# Patient Record
Sex: Male | Born: 1970 | Race: White | Hispanic: No | Marital: Married | Smoking: Never smoker
Health system: Southern US, Community
[De-identification: ages and names within clinical notes are randomized; demographics above are authoritative.]

## PROBLEM LIST (undated history)

## (undated) DIAGNOSIS — H532 Diplopia: Secondary | ICD-10-CM

## (undated) DIAGNOSIS — K219 Gastro-esophageal reflux disease without esophagitis: Secondary | ICD-10-CM

## (undated) HISTORY — PX: HERNIA REPAIR: SHX51

## (undated) HISTORY — PX: TUMOR EXCISION: SHX421

## (undated) HISTORY — DX: Diplopia: H53.2

## (undated) HISTORY — PX: WRIST SURGERY: SHX841

---

## 2017-06-22 DIAGNOSIS — J019 Acute sinusitis, unspecified: Secondary | ICD-10-CM | POA: Diagnosis not present

## 2017-11-28 DIAGNOSIS — J111 Influenza due to unidentified influenza virus with other respiratory manifestations: Secondary | ICD-10-CM | POA: Diagnosis not present

## 2017-11-30 DIAGNOSIS — J208 Acute bronchitis due to other specified organisms: Secondary | ICD-10-CM | POA: Diagnosis not present

## 2018-05-11 DIAGNOSIS — Z1211 Encounter for screening for malignant neoplasm of colon: Secondary | ICD-10-CM | POA: Diagnosis not present

## 2018-05-11 DIAGNOSIS — Z0000 Encounter for general adult medical examination without abnormal findings: Secondary | ICD-10-CM | POA: Diagnosis not present

## 2018-05-11 DIAGNOSIS — K219 Gastro-esophageal reflux disease without esophagitis: Secondary | ICD-10-CM | POA: Diagnosis not present

## 2018-05-25 DIAGNOSIS — H9201 Otalgia, right ear: Secondary | ICD-10-CM | POA: Diagnosis not present

## 2018-05-25 DIAGNOSIS — Z6830 Body mass index (BMI) 30.0-30.9, adult: Secondary | ICD-10-CM | POA: Diagnosis not present

## 2018-05-25 DIAGNOSIS — R319 Hematuria, unspecified: Secondary | ICD-10-CM | POA: Diagnosis not present

## 2018-05-29 DIAGNOSIS — L820 Inflamed seborrheic keratosis: Secondary | ICD-10-CM | POA: Diagnosis not present

## 2018-05-29 DIAGNOSIS — L74519 Primary focal hyperhidrosis, unspecified: Secondary | ICD-10-CM | POA: Diagnosis not present

## 2018-05-29 DIAGNOSIS — L739 Follicular disorder, unspecified: Secondary | ICD-10-CM | POA: Diagnosis not present

## 2018-08-31 DIAGNOSIS — E785 Hyperlipidemia, unspecified: Secondary | ICD-10-CM | POA: Diagnosis not present

## 2018-08-31 DIAGNOSIS — K219 Gastro-esophageal reflux disease without esophagitis: Secondary | ICD-10-CM | POA: Diagnosis not present

## 2018-08-31 DIAGNOSIS — R319 Hematuria, unspecified: Secondary | ICD-10-CM | POA: Diagnosis not present

## 2018-10-02 DIAGNOSIS — J019 Acute sinusitis, unspecified: Secondary | ICD-10-CM | POA: Diagnosis not present

## 2020-06-12 ENCOUNTER — Emergency Department (HOSPITAL_BASED_OUTPATIENT_CLINIC_OR_DEPARTMENT_OTHER): Payer: 59

## 2020-06-12 ENCOUNTER — Other Ambulatory Visit: Payer: Self-pay

## 2020-06-12 ENCOUNTER — Encounter (HOSPITAL_BASED_OUTPATIENT_CLINIC_OR_DEPARTMENT_OTHER): Payer: Self-pay | Admitting: *Deleted

## 2020-06-12 ENCOUNTER — Emergency Department (HOSPITAL_BASED_OUTPATIENT_CLINIC_OR_DEPARTMENT_OTHER)
Admission: EM | Admit: 2020-06-12 | Discharge: 2020-06-12 | Disposition: A | Discharge status: Home / Self Care | Payer: 59 | Attending: Emergency Medicine | Admitting: Emergency Medicine

## 2020-06-12 DIAGNOSIS — H539 Unspecified visual disturbance: Secondary | ICD-10-CM

## 2020-06-12 DIAGNOSIS — Z87891 Personal history of nicotine dependence: Secondary | ICD-10-CM | POA: Insufficient documentation

## 2020-06-12 DIAGNOSIS — R030 Elevated blood-pressure reading, without diagnosis of hypertension: Secondary | ICD-10-CM

## 2020-06-12 DIAGNOSIS — I10 Essential (primary) hypertension: Secondary | ICD-10-CM | POA: Insufficient documentation

## 2020-06-12 DIAGNOSIS — H532 Diplopia: Secondary | ICD-10-CM | POA: Insufficient documentation

## 2020-06-12 HISTORY — DX: Gastro-esophageal reflux disease without esophagitis: K21.9

## 2020-06-12 NOTE — Discharge Instructions (Addendum)
The CT scan of your head was without any acute abnormalities although did show some nasal polyps.  Please follow-up with ear nose and throat for this.  However given her symptoms today it is more for to follow-up with a ophthalmologist.  I have given you a referral to 1 you have to call to make an appointment with them.  Please inform them that you were seen in the ER for your symptoms and told to follow-up with the ophthalmologist.

## 2020-06-12 NOTE — ED Provider Notes (Signed)
[facility] EMERGENCY DEPARTMENT Provider Note   CSN: [id number] Arrival date & time: 06/12/20  1922     History Chief Complaint  Patient presents with  . Eye Problem    Roger Huff is a 49 y.o. male.  HPI Patient is a 49 year old male with past medical history of hypertension acid reflux he has not taken medications for his hypertension he is presented today with right eye diplopia.  He states that he noticed this symptom Thursday afternoon and went to an urgent care today and they advised him to come to the emergency department.  He states that he has 20/20 vision and does not wear glasses or contacts.  He denies any headaches, nausea, vomiting, lightheadedness, dizziness, weakness or numbness of the extremities or face.  Denies any other associated symptoms at all.  States he feels completely well and only notices this double vision when he is standing far away.  He does not notice this up close.     Past Medical History:  Diagnosis Date  . Acid reflux   . Hypertension     There are no problems to display for this patient.   Past Surgical History:  Procedure Laterality Date  . HERNIA REPAIR    . WRIST SURGERY         No family history on file.  Social History   Tobacco Use  . Smoking status: Never Smoker  . Smokeless tobacco: Former Network engineer  . Vaping Use: Never used  Substance Use Topics  . Alcohol use: Yes  . Drug use: Never    Home Medications Prior to Admission medications   Medication Sig Start Date End Date Taking? Authorizing Provider  omeprazole (PRILOSEC) 20 MG capsule Take by mouth. 05/19/16  Yes [provider]    Allergies    Patient has no known allergies.  Review of Systems   Review of Systems  Constitutional: Negative for chills and fever.  HENT: Negative for congestion.   Eyes: Positive for visual disturbance.  Respiratory: Negative for shortness of breath.   Cardiovascular: Negative for chest pain.   Gastrointestinal: Negative for abdominal pain.  Musculoskeletal: Negative for neck pain.    Physical Exam Updated Vital Signs BP (!) 139/102 (BP Location: Left Arm)   Pulse 83   Temp 98.4 F (36.9 C) (Oral)   Resp 16   Ht 5' 10"  (1.778 m)   Wt 85.3 kg   SpO2 100%   BMI 26.98 kg/m   Physical Exam Vitals and nursing note reviewed.  Constitutional:      General: He is not in acute distress.    Appearance: Normal appearance. He is not ill-appearing.  HENT:     Head: Normocephalic and atraumatic.     Mouth/Throat:     Mouth: Mucous membranes are moist.  Eyes:     General: No scleral icterus.       Right eye: No discharge.        Left eye: No discharge.     Extraocular Movements: Extraocular movements intact.     Conjunctiva/sclera: Conjunctivae normal.     Comments: Extraocular movements are completely intact.  No visual field cuts.  Pupils are equal reactive to light and accommodation.  Pulmonary:     Effort: Pulmonary effort is normal.     Breath sounds: No stridor.  Neurological:     Mental Status: He is alert and oriented to person, place, and time. Mental status is at baseline.  Comments: Alert and oriented to self, place, time and event.   Speech is fluent, clear without dysarthria or dysphasia.   Strength 5/5 in upper/lower extremities  Sensation intact in upper/lower extremities   Normal gait.  Negative Romberg. No pronator drift.  Normal finger-to-nose and feet tapping.  CN I not tested  CN II grossly intact visual fields bilaterally. Did not visualize posterior eye.   CN III, IV, VI PERRLA and EOMs intact bilaterally  CN V Intact sensation to sharp and light touch to the face  CN VII facial movements symmetric  CN VIII not tested  CN IX, X no uvula deviation, symmetric rise of soft palate  CN XI 5/5 SCM and trapezius strength bilaterally  CN XII Midline tongue protrusion, symmetric L/R movements      ED Results / Procedures / Treatments    Labs (all labs ordered are listed, but only abnormal results are displayed) Labs Reviewed - No data to display  EKG None  Radiology CT Head Wo Contrast  Result Date: 06/12/2020 CLINICAL DATA:  Double vision. EXAM: CT HEAD WITHOUT CONTRAST TECHNIQUE: Contiguous axial images were obtained from the base of the skull through the vertex without intravenous contrast. COMPARISON:  July 02, 2008 FINDINGS: Brain: No evidence of acute infarction, hemorrhage, hydrocephalus, extra-axial collection or mass lesion/mass effect. Vascular: No hyperdense vessel or unexpected calcification. Skull: Normal. Negative for fracture or focal lesion. Sinuses/Orbits: There is mild bilateral ethmoid sinus and left-sided frontal sinus mucosal thickening. A stable 1.4 cm x 1.4 cm posterior right maxillary sinus polyp versus mucous retention cyst is seen. A new 1.2 cm x 1.0 cm posteromedial left maxillary sinus polyp versus mucous retention cyst is noted. Other: None. IMPRESSION: 1. No acute intracranial abnormality. 2. 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 Electronically Signed   By: Virgina Norfolk M.D.   On: 06/12/2020 22:21    Procedures Procedures (including critical care time)  Medications Ordered in ED Medications - No data to display  ED Course  I have reviewed the triage vital signs and the nursing notes.  Pertinent labs & imaging results that were available during my care of the patient were reviewed by me and considered in my medical decision making (see chart for details).    MDM Rules/Calculators/A&P                          Patient is 49 year old male presented today with diplopia and only his right eye.  I did extensive physical exam to ensure that he had no neurologic abnormalities.  He is without any headache or pain   Discussed case with my attending physician Dr. Rex Kras.  Patient does need ophthalmology referral.  I provided him with this.  He will follow-up with  her ophthalmologist.  CT scan obtained shows no acute abnormality does have some maxillary sinus polyps versus mucous retention cyst which she was informed of.  Patient does have reassuringly normal neurologic exam with no neuro deficits.  He was given return precautions ultimately will need follow-up with PCP and ophthalmology.  Final Clinical Impression(s) / ED Diagnoses Final diagnoses:  Vision changes  Elevated blood pressure reading    Rx / DC Orders ED Discharge Orders    None       Tedd Sias, [place] 06/13/20 0149    Little, Wenda Overland, MD 06/13/20 1523

## 2020-06-12 NOTE — ED Triage Notes (Signed)
Pt reports he has 20/20 vision but does have prescribed glasses that he doesn't wear. States since yesterday he has been having "double vision" with distance. Reading vision is normal. He went to Urgent care and they advised him to come here

## 2020-06-21 ENCOUNTER — Encounter: Payer: Self-pay | Admitting: Neurology

## 2020-06-21 ENCOUNTER — Ambulatory Visit (INDEPENDENT_AMBULATORY_CARE_PROVIDER_SITE_OTHER): Payer: 59 | Admitting: Neurology

## 2020-06-21 DIAGNOSIS — S0440XA Injury of abducent nerve, unspecified side, initial encounter: Secondary | ICD-10-CM

## 2020-06-21 DIAGNOSIS — H532 Diplopia: Secondary | ICD-10-CM | POA: Diagnosis not present

## 2020-06-21 DIAGNOSIS — S0442XS Injury of abducent nerve, left side, sequela: Secondary | ICD-10-CM

## 2020-06-21 HISTORY — DX: Injury of abducent nerve, unspecified side, initial encounter: S04.40XA

## 2020-06-21 HISTORY — DX: Diplopia: H53.2

## 2020-06-21 NOTE — Progress Notes (Signed)
Reason for visit: Double vision  Referring physician: Dr. Otho Ket Mcclafferty is a 49 y.o. male  History of present illness:  [name] is a 49 year old white male with a history of onset of double vision around 12 June 2020.  The day before he noted he was having trouble focusing when he looked to one side or the other.  The patient then began to have double vision on primary gaze in a horizontal fashion.  The patient was noted by his eye doctor to have a left abducens palsy.  He is sent to this office for an evaluation.  He has not had any droopiness of the eyelids, he has not had any numbness or weakness of the face, arms, or legs.  He denies any significant problems with headaches or eye pain.  His maternal grandmother and his mother had diabetes, but he has no personal history of this.  He reports no difficulty with speech or swallowing or difficulty with gait instability.  He currently is on short-term disability.  He is wearing an eye patch.  Past Medical History:  Diagnosis Date  . Acid reflux   . Binocular vision disorder with diplopia     Past Surgical History:  Procedure Laterality Date  . HERNIA REPAIR    . TUMOR EXCISION     fatty tumor removed from back on neck  . WRIST SURGERY      Family History  Problem Relation Age of Onset  . Pulmonary fibrosis Mother   . Skin cancer Mother   . COPD Father   . Heart disease Father   . Prostate cancer Father     Social history:  reports that he has never smoked. He has quit using smokeless tobacco. He reports current alcohol use. He reports that he does not use drugs.  Medications:  Prior to Admission medications   Medication Sig Start Date End Date Taking? Authorizing Provider  Ascorbic Acid (VITAMIN C) 1000 MG tablet Take 1,000 mg by mouth daily.   Yes [provider]  Cholecalciferol (VITAMIN D3 PO) Take 1 tablet by mouth daily.   Yes [provider]  omeprazole (PRILOSEC) 20 MG capsule  Take 20 mg by mouth daily.   Yes [provider]     No Known Allergies  ROS:  Out of a complete 14 system review of symptoms, the patient complains only of the following symptoms, and all other reviewed systems are negative.  Double vision  Blood pressure 122/80, pulse 71, height 5' 10"  (1.778 m), weight 186 lb 8 oz (84.6 kg).  Physical Exam  General: The patient is alert and cooperative at the time of the examination.  Eyes: Pupils are equal, round, and reactive to light. Discs are flat bilaterally.  Neck: The neck is supple, no carotid bruits are noted.  Respiratory: The respiratory examination is clear.  Cardiovascular: The cardiovascular examination reveals a regular rate and rhythm, no obvious murmurs or rubs are noted.  Skin: Extremities are without significant edema.  Neurologic Exam  Mental status: The patient is alert and oriented x 3 at the time of the examination. The patient has apparent normal recent and remote memory, with an apparently normal attention span and concentration ability.  Cranial nerves: Facial symmetry is present. There is good sensation of the face to pinprick and soft touch bilaterally. The strength of the facial muscles and the muscles to head turning and shoulder shrug are normal bilaterally. Speech is well enunciated, no aphasia or  dysarthria is noted. Extraocular movements are full, with exception that there is incomplete abduction of the left eye with left lateral gaze, on primary gaze the left eye is medially deviated. Visual fields are full. The tongue is midline, and the patient has symmetric elevation of the soft palate. No obvious hearing deficits are noted.  Motor: The motor testing reveals 5 over 5 strength of all 4 extremities. Good symmetric motor tone is noted throughout.  Sensory: Sensory testing is intact to pinprick, soft touch, vibration sensation, and position sense on all 4 extremities. No evidence of extinction is  noted.  Coordination: Cerebellar testing reveals good finger-nose-finger and heel-to-shin bilaterally.  Gait and station: Gait is normal. Tandem gait is normal. Romberg is negative. No drift is seen.  Reflexes: Deep tendon reflexes are symmetric and normal bilaterally. Toes are downgoing bilaterally.   CT head 06/12/20:  IMPRESSION: 1. No acute intracranial abnormality. 2. 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  * CT scan images were reviewed online. I agree with the written report.    Assessment/Plan:  1.  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  He has no personal history of hypertension, diabetes or prior head trauma.  The patient was sent for blood work today, he will have MRI of the brain.  He will follow up here in 3 months.  I have indicated that the patient may operate a motor vehicle with an eye patch, he has to be cognizant of the fact that he would not have depth perception as well as usual.    [name] MD 06/21/2020 11:26 AM  [facility] [address] [place], [place] [address]  Phone [phone] Fax [phone]

## 2020-06-22 ENCOUNTER — Telehealth: Payer: Self-pay | Admitting: Neurology

## 2020-06-22 LAB — COMPREHENSIVE METABOLIC PANEL
ALT: 27 IU/L (ref 0–44)
AST: 20 IU/L (ref 0–40)
Albumin/Globulin Ratio: 1.9 (ref 1.2–2.2)
Albumin: 4.8 g/dL (ref 3.8–4.9)
Alkaline Phosphatase: 63 IU/L (ref 44–121)
BUN/Creatinine Ratio: 17 (ref 9–20)
BUN: 16 mg/dL (ref 6–24)
Bilirubin Total: 0.4 mg/dL (ref 0.0–1.2)
CO2: 22 mmol/L (ref 20–29)
Calcium: 9.7 mg/dL (ref 8.7–10.2)
Chloride: 102 mmol/L (ref 96–106)
Creatinine, Ser: 0.96 mg/dL (ref 0.76–1.27)
GFR calc Af Amer: 105 mL/min/{1.73_m2} (ref >59)
GFR calc non Af Amer: 91 mL/min/{1.73_m2} (ref >59)
Globulin, Total: 2.5 g/dL (ref 1.5–4.5)
Glucose: 86 mg/dL (ref 65–99)
Potassium: 4.7 mmol/L (ref 3.5–5.2)
Sodium: 139 mmol/L (ref 134–144)
Total Protein: 7.3 g/dL (ref 6.0–8.5)

## 2020-06-22 LAB — VITAMIN B12: Vitamin B-12: 529 pg/mL (ref 232–1245)

## 2020-06-22 LAB — HEMOGLOBIN A1C
Est. average glucose Bld gHb Est-mCnc: 111 mg/dL
Hgb A1c MFr Bld: 5.5 % (ref 4.8–5.6)

## 2020-06-22 LAB — TSH: TSH: 0.971 u[IU]/mL (ref 0.450–4.500)

## 2020-06-22 LAB — ACETYLCHOLINE RECEPTOR, BINDING: AChR Binding Ab, Serum: 0.03 nmol/L (ref 0.00–0.24)

## 2020-06-22 LAB — ANGIOTENSIN CONVERTING ENZYME: Angio Convert Enzyme: 26 U/L (ref 14–82)

## 2020-06-22 LAB — SEDIMENTATION RATE: Sed Rate: 2 mm/hr (ref 0–30)

## 2020-06-22 LAB — ANA W/REFLEX: Anti Nuclear Antibody (ANA): NEGATIVE

## 2020-06-22 LAB — B. BURGDORFI ANTIBODIES: Lyme IgG/IgM Ab: <0.91 {ISR} (ref 0.00–0.90)

## 2020-06-22 NOTE — Telephone Encounter (Signed)
no to the covid questions MR Brain w/wo contrast Dr. Jannifer Franklin [facility] Josem Kaufmann: [id number] (exp. 06/22/20 to 08/06/20). Patient is scheduled at [facility] for 06/23/20.

## 2020-06-23 ENCOUNTER — Ambulatory Visit (INDEPENDENT_AMBULATORY_CARE_PROVIDER_SITE_OTHER): Payer: 59

## 2020-06-23 ENCOUNTER — Telehealth: Payer: Self-pay | Admitting: Emergency Medicine

## 2020-06-23 DIAGNOSIS — H532 Diplopia: Secondary | ICD-10-CM | POA: Diagnosis not present

## 2020-06-23 MED ORDER — GADOBENATE DIMEGLUMINE 529 MG/ML IV SOLN
15.0000 mL | Freq: Once | INTRAVENOUS | Status: AC | PRN
  Administered 2020-06-23: 15 mL via INTRAVENOUS

## 2020-06-23 NOTE — Telephone Encounter (Signed)
Called patient and went over Dr. Tobey Grim review of patient's recent blood work.  Patient had no questions and expressed appreciation for the call.

## 2020-06-23 NOTE — Telephone Encounter (Signed)
-----   Message from Kathrynn Ducking, MD sent at 06/22/2020  6:28 PM EST -----  The blood work results are unremarkable. Please call the patient. ----- Message ----- From: Lavone Neri Lab Results In Sent: 06/22/2020   7:37 AM EST To: Kathrynn Ducking, MD

## 2020-06-25 ENCOUNTER — Telehealth: Payer: Self-pay | Admitting: Neurology

## 2020-06-25 NOTE — Telephone Encounter (Signed)
I called the patient.  MRI was unremarkable, expect gradual improvement in double vision over the next 3 to 5 months.

## 2020-06-25 NOTE — Telephone Encounter (Signed)
MRI of the brain was unremarkable, some sinus disease noted, nothing that would result in double vision.   MRI brain 06/25/20:  IMPRESSION:   Unremarkable MRI brain (with and without). No acute findings. Incidental mucus thickening in the ethmoid, frontal and maxillary sinuses.

## 2020-07-07 ENCOUNTER — Telehealth: Payer: Self-pay | Admitting: Neurology

## 2020-07-07 NOTE — Telephone Encounter (Signed)
Pt. Paid the fee via phone for his [name] form to be filled out.

## 2020-07-12 ENCOUNTER — Telehealth: Payer: Self-pay | Admitting: Emergency Medicine

## 2020-07-12 DIAGNOSIS — Z0289 Encounter for other administrative examinations: Secondary | ICD-10-CM

## 2020-07-12 NOTE — Telephone Encounter (Signed)
Called and spoke to [facility], notified her that Dr. Jannifer Franklin will not be back in office til Monday.  She sent message to examiner and allowing additional time to complete and send in paperwork.

## 2020-07-12 NOTE — Telephone Encounter (Addendum)
Pt wife called to check on status of form to be filled out by MD. She spoke with work place and they have not received completed form yet. Advised I will send his nurse a message to call back with an update on this.Call back number: [phone].

## 2020-07-12 NOTE — Telephone Encounter (Signed)
Shirlean Mylar, patient's wife, is calling again just to check the status of patient's paperwork. Requesting a call back.

## 2020-07-13 NOTE — Telephone Encounter (Signed)
Baldomero Lamy, RN 07/12/20 10:37 AM Note: Called and spoke to [facility], notified her that Dr. Jannifer Franklin will not be back in office til Monday.  She sent message to examiner and allowing additional time to complete and send in paperwork.       Separate phone call about this matter (see above). I called his wife back and provided her with this update.

## 2020-07-13 NOTE — Telephone Encounter (Signed)
I spoke to the patient. States he is concerned about driving and climbing heights with his diplopia. He would like to discuss his restrictions and return to work date with Dr. Jannifer Franklin. Mr. Ogan is aware that he is out of the office until 07/19/20.  The best number to reach the patient is [phone].

## 2020-07-13 NOTE — Telephone Encounter (Signed)
Wife called back wanting to speak with nurse. Call transferred to [facility].

## 2020-07-15 NOTE — Telephone Encounter (Signed)
I called patient.  The patient apparently has been taken out of work by the ophthalmologist.  I have indicated that he may operate a motor vehicle as [employer] patches 1 eye, he has to be careful about keeping distance between his vehicle and the next one in front.  This is documented in my original note.  He claimed that he would be seen by the ophthalmologist on the 12th or 13 January, he wants me to write him out of work until then.  I will fill out the FMLA form when I return to the office.

## 2020-07-19 NOTE — Telephone Encounter (Signed)
Disability paperwork completed by Dr. Jannifer Franklin and ready for pickup.  Hilda Blades in Medical Records has the paperwork.

## 2020-09-09 NOTE — Telephone Encounter (Signed)
Received fax requesting last office notes.  Information faxed this morning to the number provided.  OK transmission received.

## 2020-09-16 NOTE — Progress Notes (Signed)
PATIENT: Roger Huff DOB: 1970-07-30  REASON FOR VISIT: follow up HISTORY FROM: patient  HISTORY OF PRESENT ILLNESS: Today 09/20/20 Roger Huff is a 50 year old male with history of onset of double vision in November 2021.  He was diagnosed with a left abducens palsy.  MRI of the brain was unremarkable, some sinus disease noted, nothing that would result in double vision.  Extensive laboratory evaluation was unremarkable.  He is about 3-1/2 months out from onset, is about 95% back to normal.  Is no longer using the patch.  Only has double vision with left horizontal gaze if looking hard, can bring it into focus.  Is back at work, works for a [employer], office and hands-on job.  Has a follow-up with his ophthalmologist next week.  Here today for evaluation accompanied by his wife.  HISTORY  06/21/2020 Dr. Jannifer Franklin: Roger Huff is a 50 year old white male with a history of onset of double vision around 12 June 2020.  The day before he noted he was having trouble focusing when he looked to one side or the other.  The patient then began to have double vision on primary gaze in a horizontal fashion.  The patient was noted by his eye doctor to have a left abducens palsy.  He is sent to this office for an evaluation.  He has not had any droopiness of the eyelids, he has not had any numbness or weakness of the face, arms, or legs.  He denies any significant problems with headaches or eye pain.  His maternal grandmother and his mother had diabetes, but he has no personal history of this.  He reports no difficulty with speech or swallowing or difficulty with gait instability.  He currently is on short-term disability.  He is wearing an eye patch.  REVIEW OF SYSTEMS: Out of a complete 14 system review of symptoms, the patient complains only of the following symptoms, and all other reviewed systems are negative.  n/a  ALLERGIES: No Known Allergies  HOME MEDICATIONS: Outpatient Medications Prior  to Visit  Medication Sig Dispense Refill  . Ascorbic Acid (VITAMIN C) 1000 MG tablet Take 1,000 mg by mouth daily.    . Cholecalciferol (VITAMIN D3 PO) Take 1 tablet by mouth daily.    Marland Kitchen omeprazole (PRILOSEC) 20 MG capsule Take 20 mg by mouth daily.     No facility-administered medications prior to visit.    PAST MEDICAL HISTORY: Past Medical History:  Diagnosis Date  . 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 Acid reflux   . Binocular vision disorder with diplopia   . Diplopia 06/21/2020    PAST SURGICAL HISTORY: Past Surgical History:  Procedure Laterality Date  . HERNIA REPAIR    . TUMOR EXCISION     fatty tumor removed from back on neck  . WRIST SURGERY      FAMILY HISTORY: Family History  Problem Relation Age of Onset  . Pulmonary fibrosis Mother   . Skin cancer Mother   . Diabetes Mother   . COPD Father   . Heart disease Father   . Prostate cancer Father   . Healthy Brother     SOCIAL HISTORY: Social History   Socioeconomic History  . Marital status: Married    Spouse name: Not on file  . Number of children: 2  . Years of education: some college  . Highest education level: Not on file  Occupational History  . 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 Smoking status: Never Smoker  . Smokeless tobacco: Former Network engineer  . Vaping Use: Never used  Substance and Sexual Activity  . Alcohol use: Yes    Comment: social  . Drug use: Never  . Sexual activity: Not on file  Other Topics Concern  . Not on file  Social History Narrative   Lives at home with his wife.   Right-handed.   Caffeine use: 20 ounces of coffee per day.   Social Determinants of Health   Financial Resource Strain: Not on file  Food Insecurity: Not on file  Transportation Needs: Not on file  Physical Activity: Not on file  Stress: Not on file  Social Connections: Not on file  Intimate Partner Violence: Not on file   PHYSICAL EXAM  Vitals:    09/20/20 1016  BP: 134/84  Pulse: 77   There is no height or weight on file to calculate BMI.  Generalized: Well developed, in no acute distress   Neurological examination  Mentation: Alert oriented to time, place, history taking. Follows all commands speech and language fluent Cranial nerve II-XII: Pupils were equal round reactive to light. Extraocular movements were full, visual field were full on confrontational test, complete abduction of the left eye with lateral gaze. Facial sensation and strength were normal.  Head turning and shoulder shrug  were normal and symmetric. Motor: The motor testing reveals 5 over 5 strength of all 4 extremities. Good symmetric motor tone is noted throughout.  Sensory: Sensory testing is intact to soft touch on all 4 extremities. No evidence of extinction is noted.  Coordination: Cerebellar testing reveals good finger-nose-finger and heel-to-shin bilaterally.  Gait and station: Gait is normal. Tandem gait is normal. Romberg is negative. No drift is seen.  Reflexes: Deep tendon reflexes are symmetric and normal bilaterally.   DIAGNOSTIC DATA (LABS, IMAGING, TESTING) - I reviewed patient records, labs, notes, testing and imaging myself where available.  No results found for: WBC, HGB, HCT, MCV, PLT    Component Value Date/Time   NA 139 06/21/2020 1157   K 4.7 06/21/2020 1157   CL 102 06/21/2020 1157   CO2 22 06/21/2020 1157   GLUCOSE 86 06/21/2020 1157   BUN 16 06/21/2020 1157   CREATININE 0.96 06/21/2020 1157   CALCIUM 9.7 06/21/2020 1157   PROT 7.3 06/21/2020 1157   ALBUMIN 4.8 06/21/2020 1157   AST 20 06/21/2020 1157   ALT 27 06/21/2020 1157   ALKPHOS 63 06/21/2020 1157   BILITOT 0.4 06/21/2020 1157   GFRNONAA 91 06/21/2020 1157   GFRAA 105 06/21/2020 1157   No results found for: CHOL, HDL, LDLCALC, LDLDIRECT, TRIG, CHOLHDL Lab Results  Component Value Date   HGBA1C 5.5 06/21/2020   Lab Results  Component Value Date   [id number]  357 06/21/2020   Lab Results  Component Value Date   TSH 0.971 06/21/2020      ASSESSMENT AND PLAN 50 y.o. 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 here with:  1.  Left abducens palsy in November 2021  -Has about 95% improvement about 3-1/2 months from onset, is back to driving, no longer wearing the patch, has returned to work -MRI of the brain was unremarkable in December 2021 -Extensive laboratory evaluation was unremarkable -Hopefully will regain 100% function in the next few weeks -Encouraged to continue follow-up with PCP, ensure  screening for DM, HTN, HLD -Follow-up at our office on an as-needed basis  I spent 20 minutes of face-to-face and non-face-to-face time with patient.  This included previsit chart review, lab review, study review, order entry, electronic health record documentation, patient education.  Butler Denmark, AGNP-C, DNP 09/20/2020, 10:28 AM [facility] [address], [place], [place] [address] [phone]

## 2020-09-20 ENCOUNTER — Ambulatory Visit (INDEPENDENT_AMBULATORY_CARE_PROVIDER_SITE_OTHER): Payer: 59 | Admitting: Neurology

## 2020-09-20 ENCOUNTER — Encounter: Payer: Self-pay | Admitting: Neurology

## 2020-09-20 VITALS — BP 134/84 | HR 77

## 2020-09-20 DIAGNOSIS — H532 Diplopia: Secondary | ICD-10-CM | POA: Diagnosis not present

## 2020-09-20 DIAGNOSIS — S0442XS Injury of abducent nerve, left side, sequela: Secondary | ICD-10-CM

## 2020-09-20 NOTE — Progress Notes (Signed)
I have read the note, and I agree with the clinical assessment and plan.  Winnie Umali K Daiveon Markman   

## 2020-09-20 NOTE — Patient Instructions (Signed)
I am glad you are doing much better! Continue to see your primary doctor See you back as needed

## 2020-09-30 ENCOUNTER — Encounter: Payer: Self-pay | Admitting: *Deleted

## 2020-09-30 ENCOUNTER — Telehealth: Payer: Self-pay | Admitting: *Deleted

## 2020-09-30 NOTE — Telephone Encounter (Signed)
Received [name] FMLA papers for return to work on 09/20/20. Per S SLack, NP patient can return to work full time without restrictions. Called patient to discuss. He returned to work full time without restrictions on 09/20/20, needs that reflected on paperwork. [employer] paperwork completed, on NP's desk for review, signature.

## 2020-09-30 NOTE — Telephone Encounter (Signed)
[employer] papers completed, signed and sent to medical records for processing.

## 2021-11-22 IMAGING — CT CT HEAD W/O CM
3 series · 15 of 47 positions shown, 18 images · non-contrast
Comparison: July 02, 2008

CLINICAL DATA: Double vision.

EXAM:
CT HEAD WITHOUT CONTRAST
TECHNIQUE: Contiguous axial images were obtained from the base of the skull
through the vertex without intravenous contrast.

[Series 2: head wo · axial · 0.46mm/px · z∈[+928,+1058]mm · 9 of 32 slices shown, 12 images]
[im 3/32  brain]
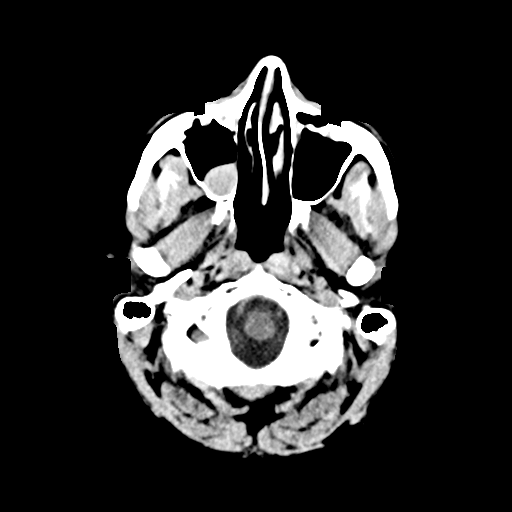
[im 3/32  bone]
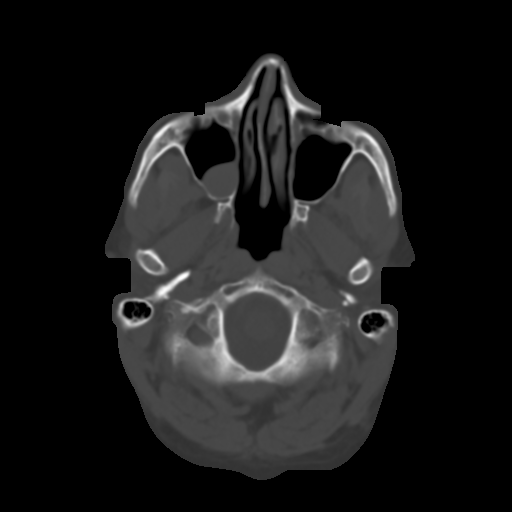
[im 6/32  brain]
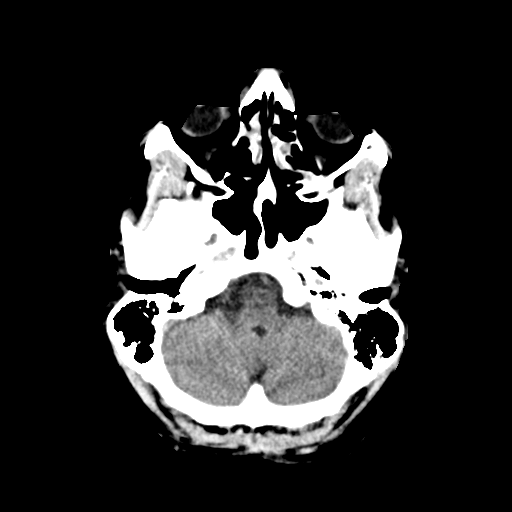
[im 9/32  brain]
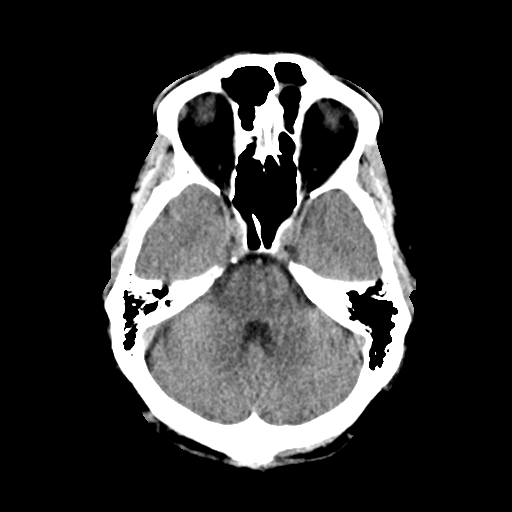
[im 12/32  brain]
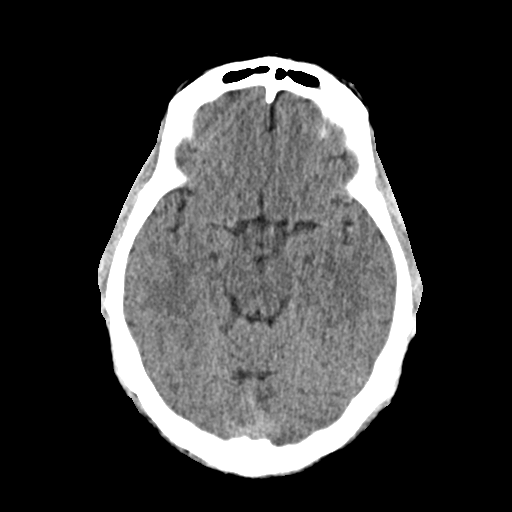
[im 17/32  brain]
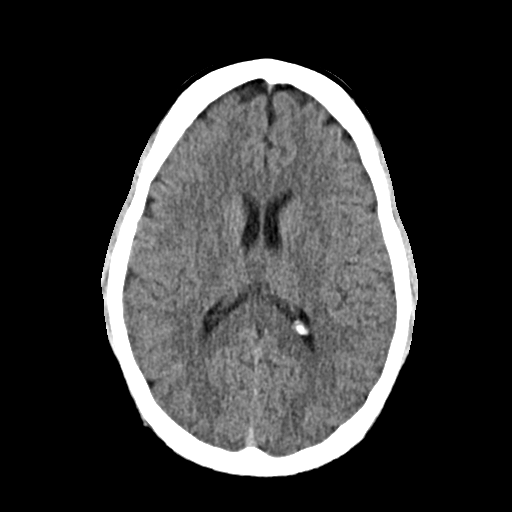
[im 17/32  bone]
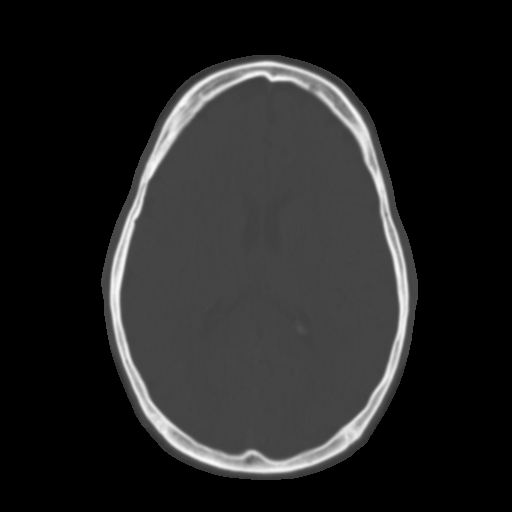
[im 20/32  brain]
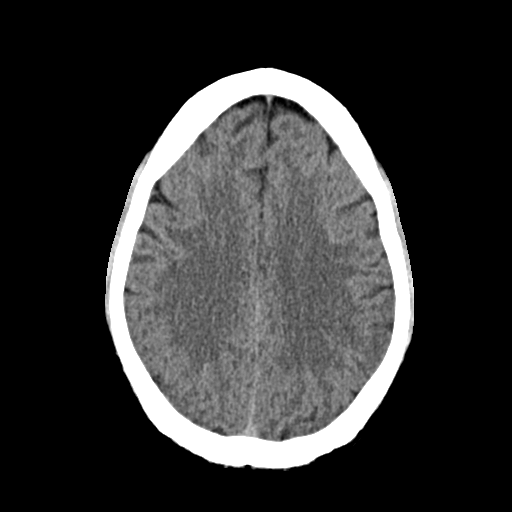
[im 23/32  brain]
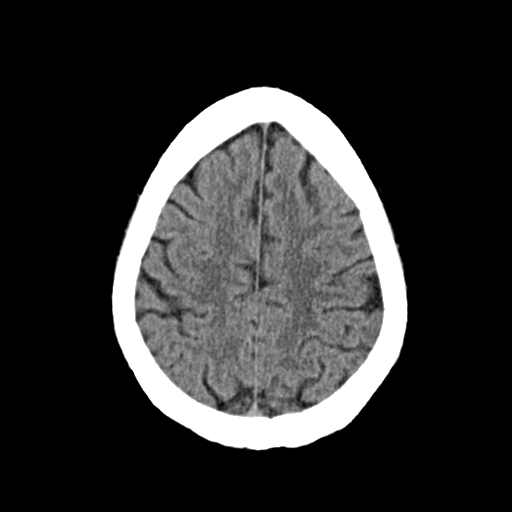
[im 26/32  brain]
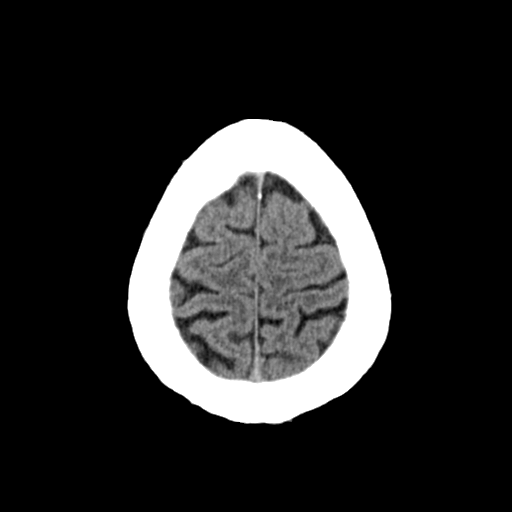
[im 29/32  brain]
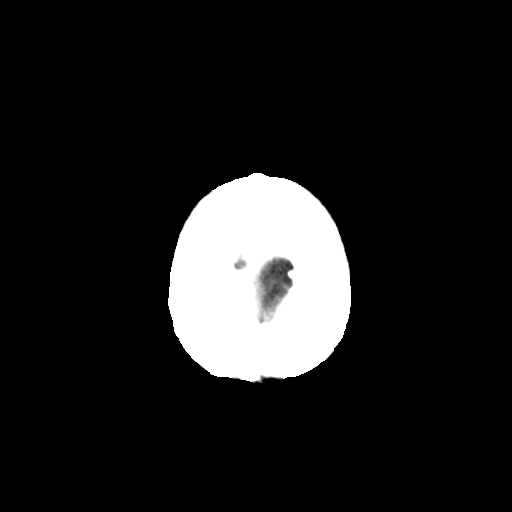
[im 29/32  bone]
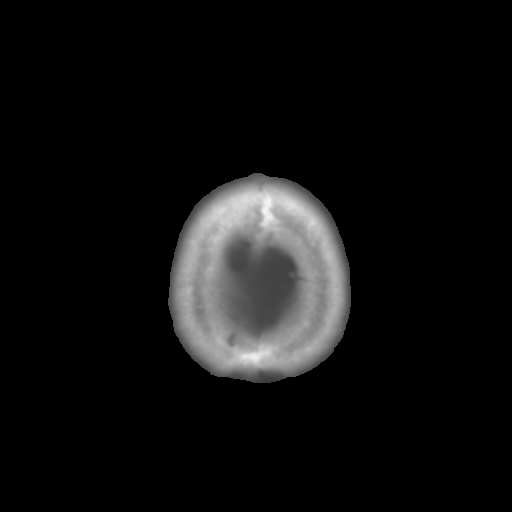

[Series 4: coronal soft · coronal · 0.32mm/px · 3 of 71 slices shown]
[im 24/71  brain]
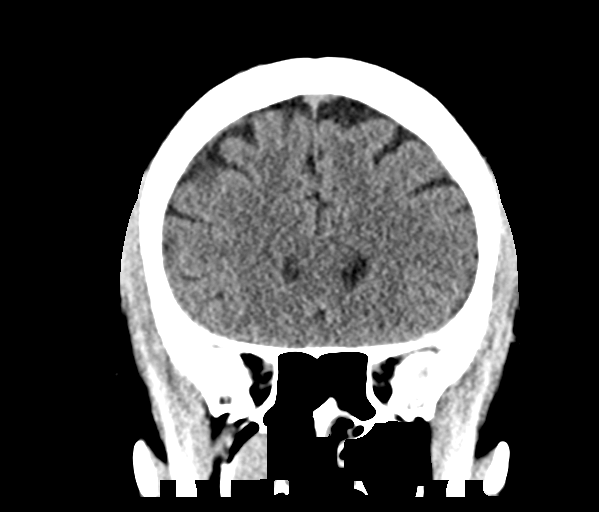
[im 32/71  brain]
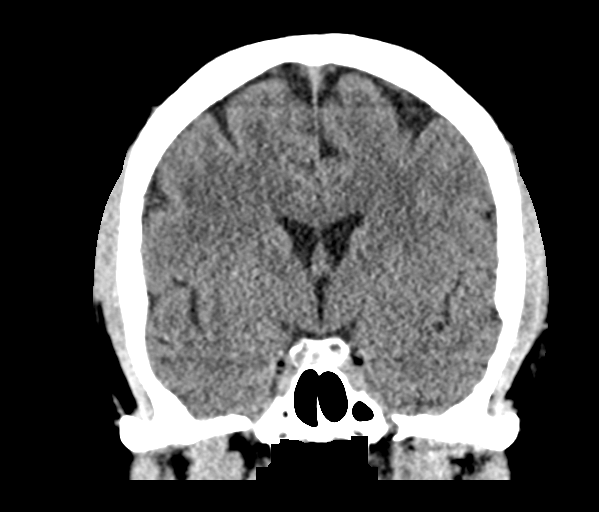
[im 39/71  brain]
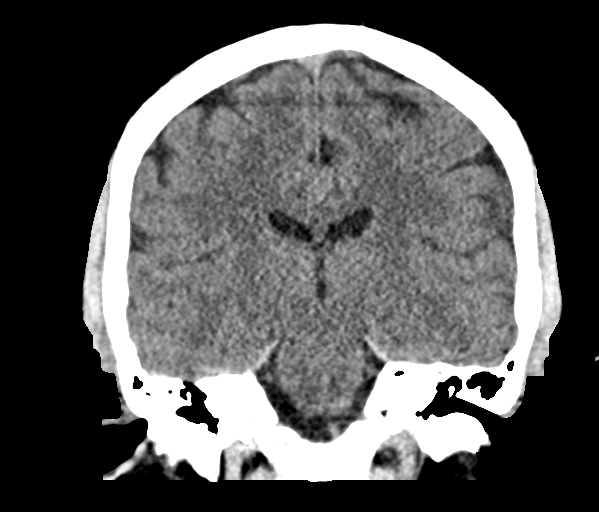

[Series 5: sag soft · sagittal · 0.32mm/px · 3 of 58 slices shown]
[im 20/58  brain]
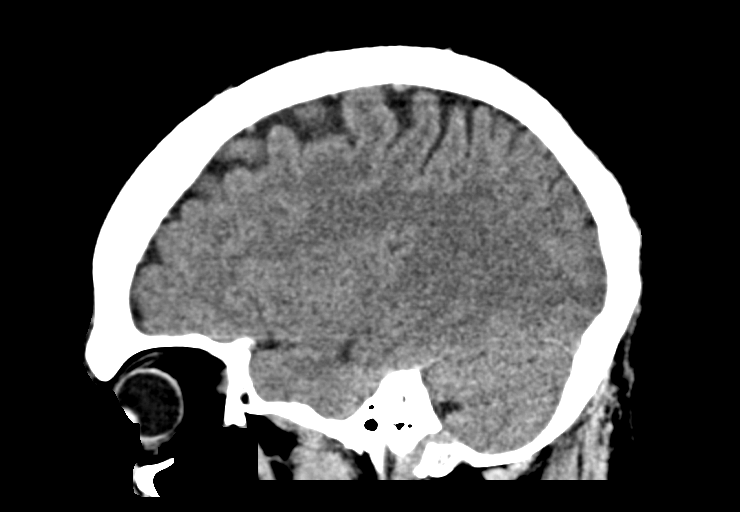
[im 29/58  brain]
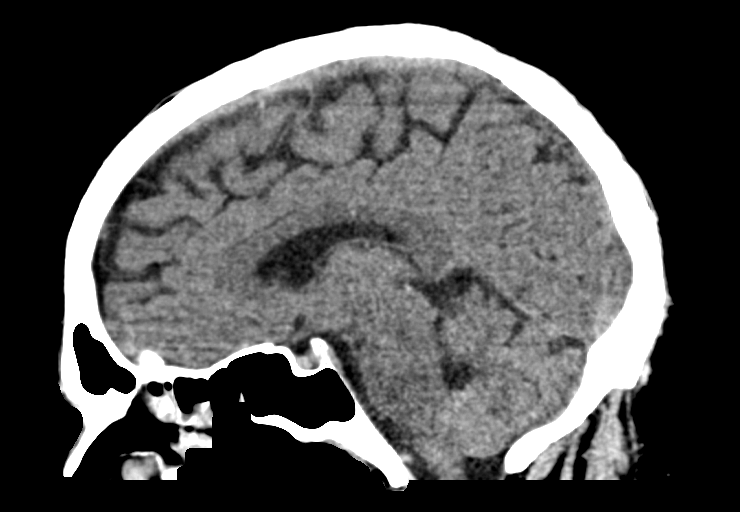
[im 39/58  brain]
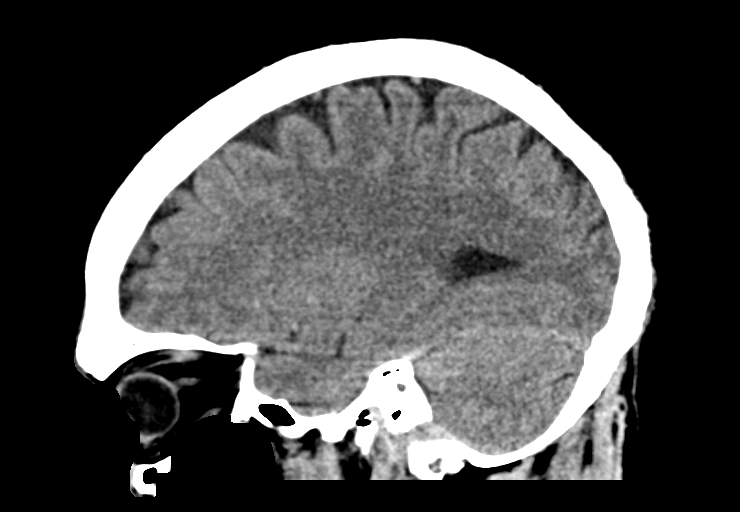

[15 of 47 positions shown; findings below may reference images not displayed]

FINDINGS: Brain: No evidence of acute infarction, hemorrhage, hydrocephalus,
extra-axial collection or mass lesion/mass effect.

Vascular: No hyperdense vessel or unexpected calcification.

Skull: Normal. Negative for fracture or focal lesion.

Sinuses/Orbits: There is mild bilateral ethmoid sinus and left-sided
frontal sinus mucosal thickening. A stable 1.4 cm x 1.4 cm posterior
right maxillary sinus polyp versus mucous retention cyst is seen. A
new 1.2 cm x 1.0 cm posteromedial left maxillary sinus polyp versus
mucous retention cyst is noted.

Other: None.
IMPRESSION: 1. No acute intracranial abnormality.
2. Bilateral maxillary sinus polyps versus mucous retention cysts.

## 2022-01-20 DIAGNOSIS — L821 Other seborrheic keratosis: Secondary | ICD-10-CM | POA: Diagnosis not present

## 2022-01-20 DIAGNOSIS — L820 Inflamed seborrheic keratosis: Secondary | ICD-10-CM | POA: Diagnosis not present

## 2022-01-20 DIAGNOSIS — L814 Other melanin hyperpigmentation: Secondary | ICD-10-CM | POA: Diagnosis not present

## 2022-02-13 DIAGNOSIS — J019 Acute sinusitis, unspecified: Secondary | ICD-10-CM | POA: Diagnosis not present

## 2022-08-01 DIAGNOSIS — R0981 Nasal congestion: Secondary | ICD-10-CM | POA: Diagnosis not present

## 2022-08-01 DIAGNOSIS — R051 Acute cough: Secondary | ICD-10-CM | POA: Diagnosis not present

## 2022-08-01 DIAGNOSIS — R509 Fever, unspecified: Secondary | ICD-10-CM | POA: Diagnosis not present

## 2022-08-04 DIAGNOSIS — J208 Acute bronchitis due to other specified organisms: Secondary | ICD-10-CM | POA: Diagnosis not present

## 2023-05-04 DIAGNOSIS — J019 Acute sinusitis, unspecified: Secondary | ICD-10-CM | POA: Diagnosis not present

## 2023-05-04 DIAGNOSIS — R051 Acute cough: Secondary | ICD-10-CM | POA: Diagnosis not present

## 2023-05-04 DIAGNOSIS — Z1159 Encounter for screening for other viral diseases: Secondary | ICD-10-CM | POA: Diagnosis not present

## 2023-06-17 DIAGNOSIS — J0110 Acute frontal sinusitis, unspecified: Secondary | ICD-10-CM | POA: Diagnosis not present

## 2024-01-23 DIAGNOSIS — R051 Acute cough: Secondary | ICD-10-CM | POA: Diagnosis not present

## 2024-01-23 DIAGNOSIS — J060 Acute laryngopharyngitis: Secondary | ICD-10-CM | POA: Diagnosis not present

## 2024-01-23 DIAGNOSIS — R0981 Nasal congestion: Secondary | ICD-10-CM | POA: Diagnosis not present
